# Patient Record
Sex: Female | Born: 1935 | Hispanic: Refuse to answer
Health system: Southern US, Community
[De-identification: ages and names within clinical notes are randomized; demographics above are authoritative.]

---

## 2005-09-20 ENCOUNTER — Ambulatory Visit: Payer: Self-pay | Admitting: Nurse Practitioner

## 2006-08-24 ENCOUNTER — Ambulatory Visit: Payer: Self-pay | Admitting: Ophthalmology

## 2006-11-09 ENCOUNTER — Ambulatory Visit: Payer: Self-pay | Admitting: Ophthalmology

## 2007-02-21 ENCOUNTER — Ambulatory Visit: Payer: Self-pay | Admitting: Nurse Practitioner

## 2008-07-04 ENCOUNTER — Other Ambulatory Visit: Payer: Self-pay

## 2008-07-04 ENCOUNTER — Inpatient Hospital Stay: Payer: Self-pay | Admitting: *Deleted

## 2008-07-12 ENCOUNTER — Encounter: Payer: Self-pay | Admitting: Internal Medicine

## 2008-07-20 ENCOUNTER — Encounter: Payer: Self-pay | Admitting: Internal Medicine

## 2008-08-07 ENCOUNTER — Ambulatory Visit: Payer: Self-pay | Admitting: Family Medicine

## 2008-09-19 ENCOUNTER — Ambulatory Visit: Payer: Self-pay | Admitting: Oncology

## 2008-09-30 ENCOUNTER — Inpatient Hospital Stay: Payer: Self-pay | Admitting: Internal Medicine

## 2008-10-20 ENCOUNTER — Ambulatory Visit: Payer: Self-pay | Admitting: Oncology

## 2009-12-08 ENCOUNTER — Inpatient Hospital Stay: Payer: Self-pay | Admitting: Specialist

## 2010-01-05 ENCOUNTER — Inpatient Hospital Stay: Payer: Self-pay | Admitting: Specialist

## 2010-04-19 ENCOUNTER — Inpatient Hospital Stay: Payer: Self-pay | Admitting: Internal Medicine

## 2010-04-25 ENCOUNTER — Encounter: Payer: Self-pay | Admitting: Internal Medicine

## 2010-04-30 ENCOUNTER — Inpatient Hospital Stay: Payer: Self-pay | Admitting: Internal Medicine

## 2010-05-04 ENCOUNTER — Ambulatory Visit: Payer: Self-pay | Admitting: Cardiovascular Disease

## 2010-05-20 ENCOUNTER — Encounter: Payer: Self-pay | Admitting: Internal Medicine

## 2010-06-19 ENCOUNTER — Encounter: Payer: Self-pay | Admitting: Internal Medicine

## 2010-07-20 ENCOUNTER — Ambulatory Visit: Payer: Self-pay | Admitting: Gastroenterology

## 2010-12-15 IMAGING — US ABDOMEN ULTRASOUND
1 series · 17 of 25 positions shown · non-contrast
Comparison: none

REASON FOR EXAM: r/o cholecystitis
COMMENTS:

PROCEDURE:     US  - US ABDOMEN GENERAL SURVEY  - May 02, 2010  [DATE]
RESULT:     Comparison: None
TECHNIQUE: Multiple gray-scale and color-flow Doppler images of the abdomen
are presented for review.

[Series 1: abdomen ultrasound · 17 of 50 slices shown]
[im 1/50]
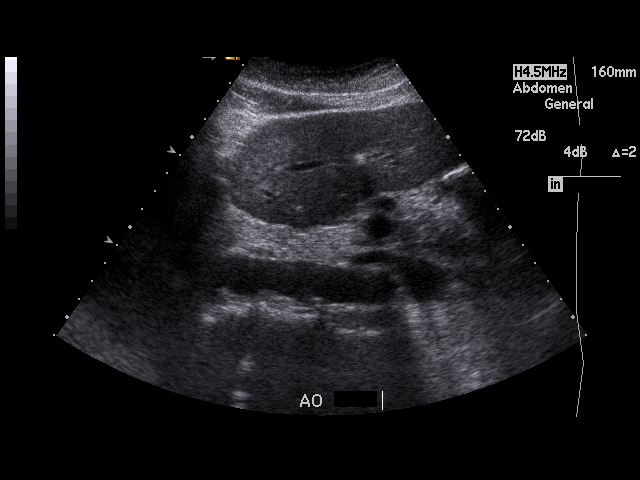
[im 5/50]
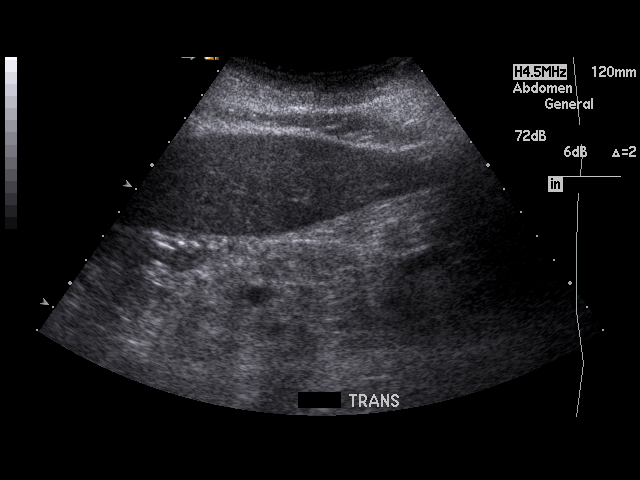
[im 7/50]
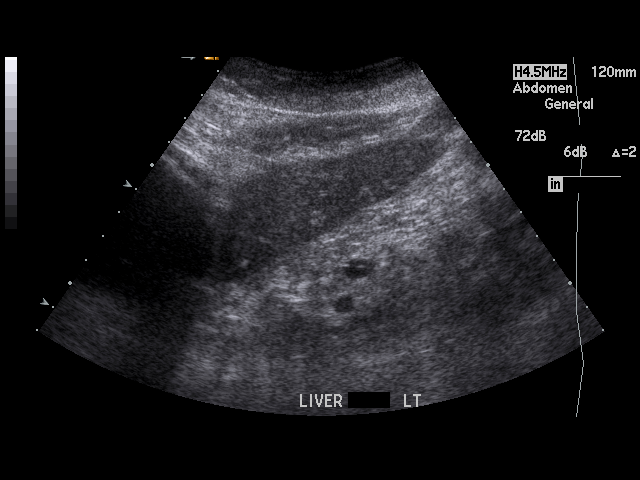
[im 11/50]
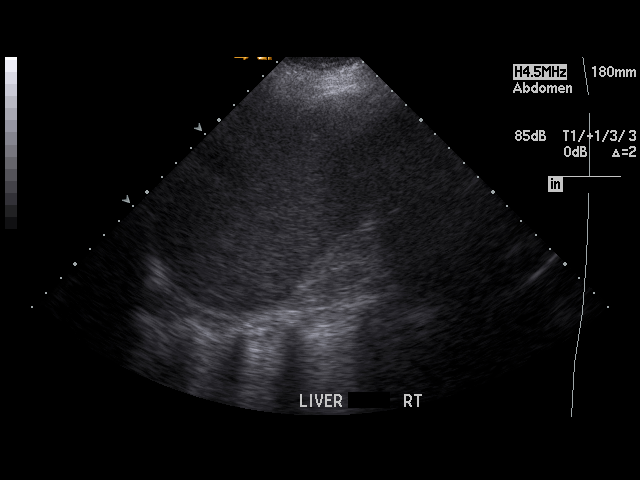
[im 13/50]
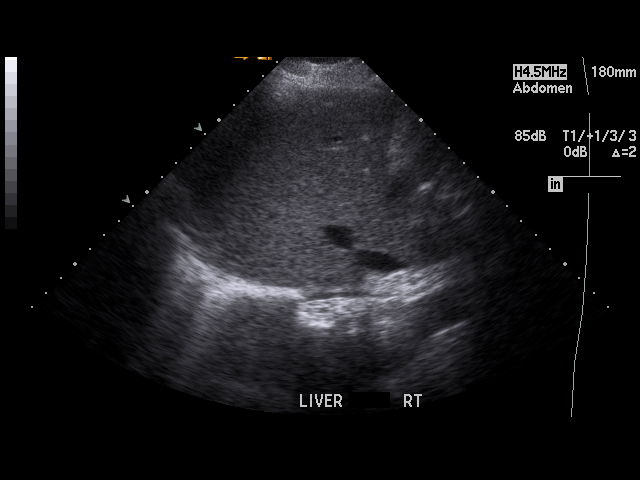
[im 17/50]
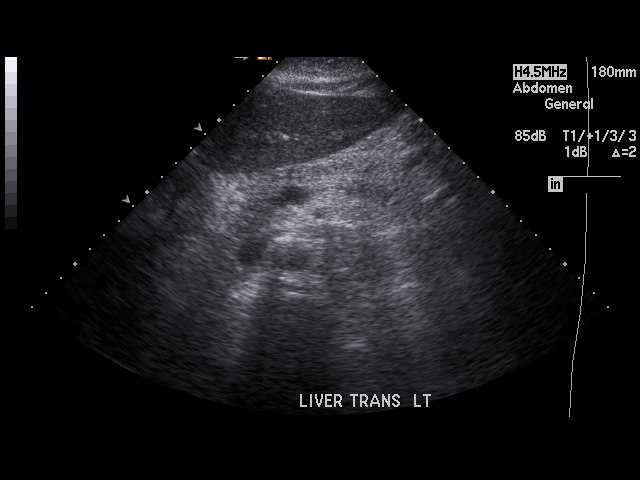
[im 19/50]
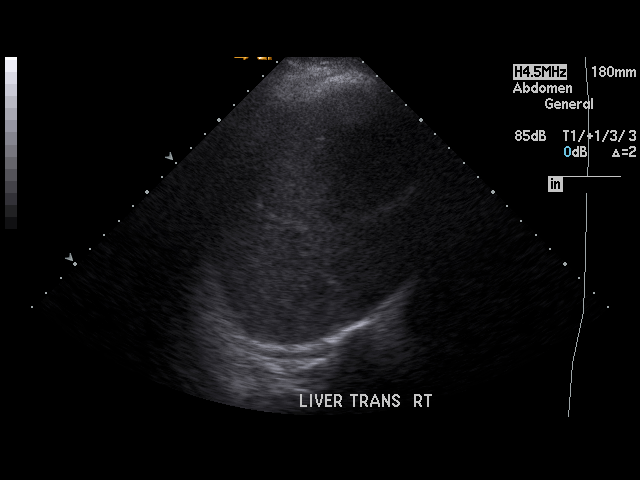
[im 23/50]
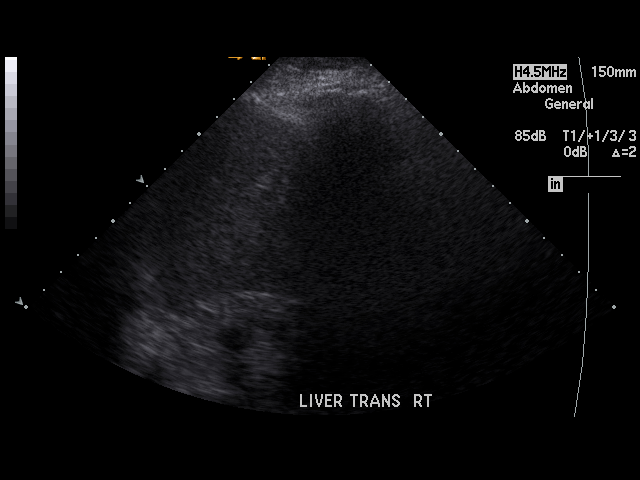
[im 25/50]
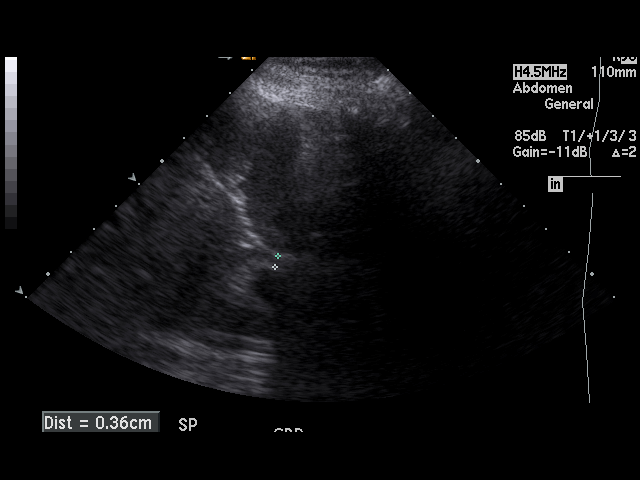
[im 27/50]
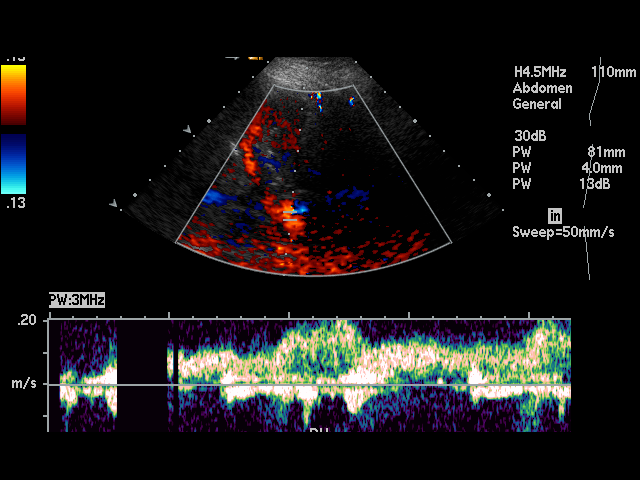
[im 31/50]
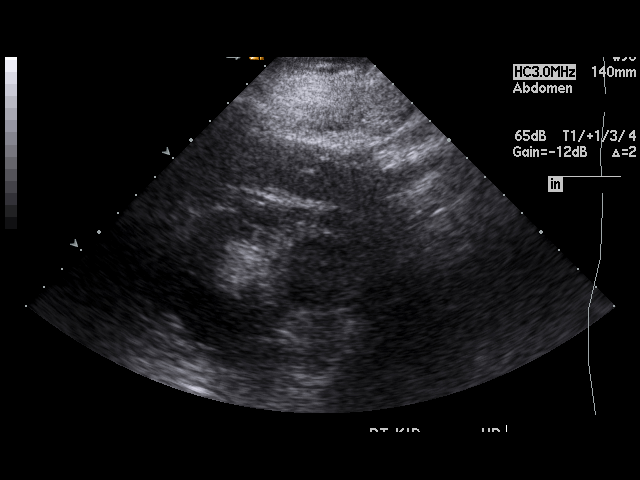
[im 33/50]
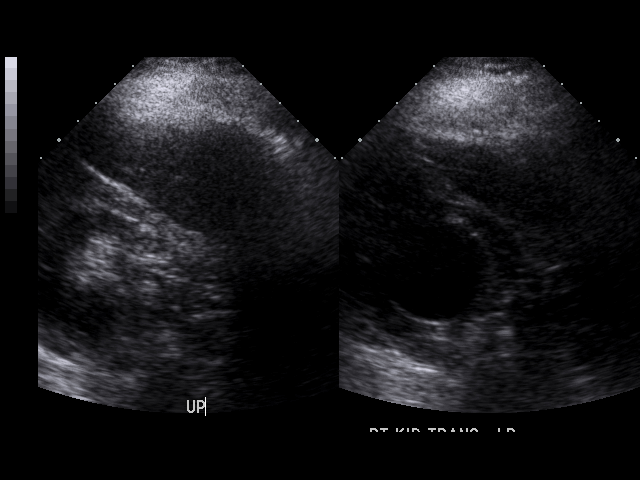
[im 37/50]
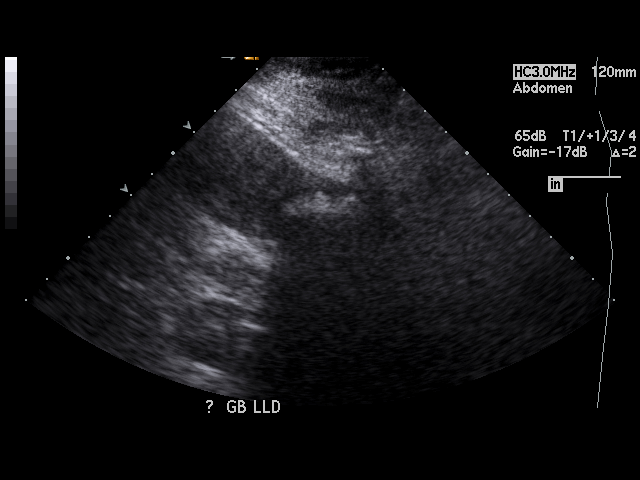
[im 39/50]
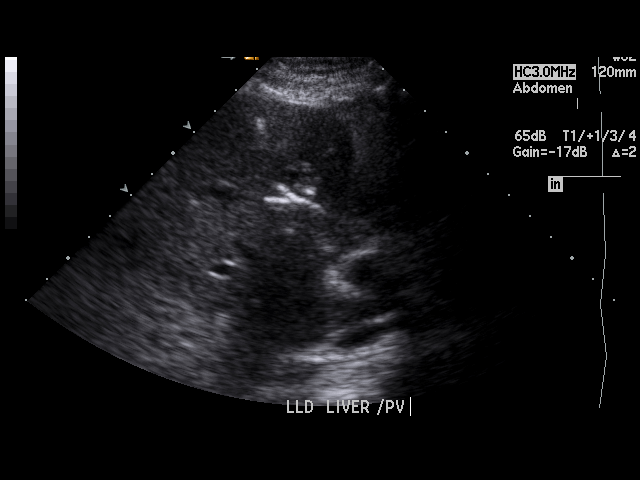
[im 43/50]
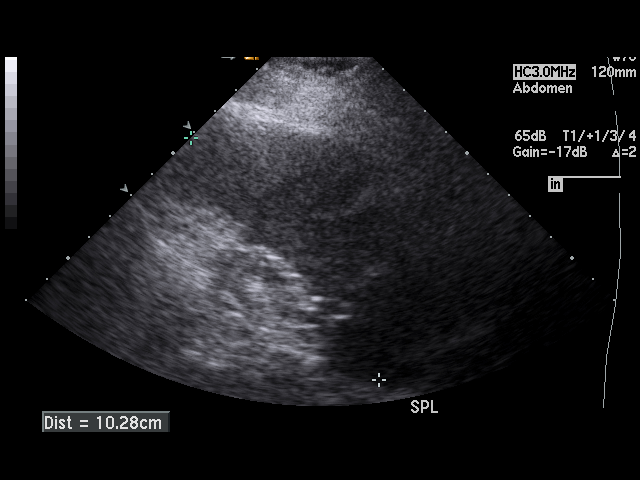
[im 45/50]
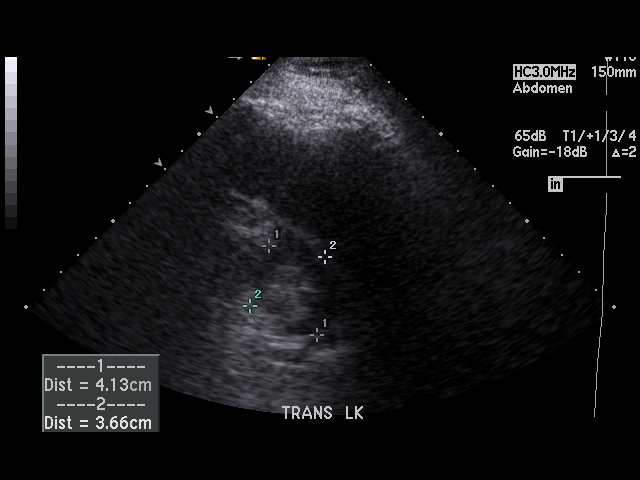
[im 50/50]
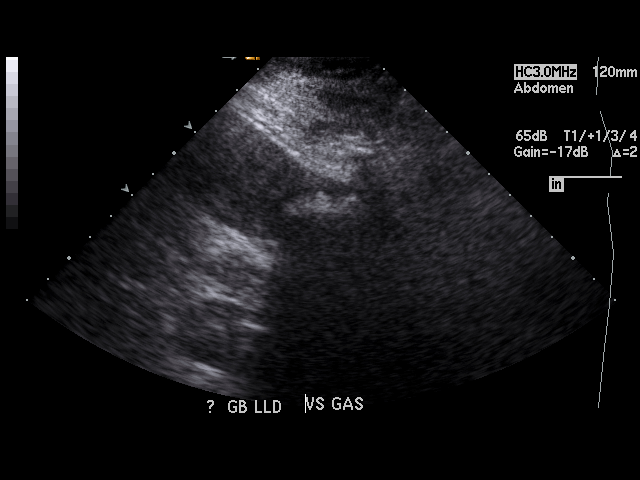

[17 of 25 positions shown; findings below may reference images not displayed]

FINDINGS: Visualized portions of the liver demonstrate normal echogenicity and normal
contours. The liver is without evidence of a focal hepatic lesion.

There is pneumobilia secondary to likely from ERCP yesterday which limits
evaluation. The gallbladder and common bile duct are difficult to visualized
secondary to pneumobilia. There are cholelithiasis. There is no definite
intrahepatic biliary ductal dilatation. The visualized portion of the common
bile duct measures 3.6 mm, but the entirety of the common bile duct is not
visualized.

The visualized portion of the pancreas is normal in echogenicity. The spleen
is unremarkable. Bilateral kidneys are normal in echogenicity and size. The
right kidney measures 9.2 x 4.5 x 3.5 cm. The left kidney measures 9.3 x
x 3.7 cm. Again noted are multiple right renal hypodense masses which are
suboptimally characterized on this exam, but appear unchanged from the prior
exam There are no renal calculi or hydronephrosis. The abdominal aorta and
IVC are unremarkable.
IMPRESSION: There is pneumobilia secondary to likely from ERCP yesterday which limits
evaluation. The gallbladder and common bile duct are difficult to visualized
secondary to pneumobilia. There are cholelithiasis. There is no definite
intrahepatic biliary ductal dilatation. The visualized portion of the common
bile duct measures 3.6 mm, but the entirety of the common bile duct is not
visualized.

## 2010-12-17 IMAGING — CR DG CHEST 1V PORT
1 series · 1 of 1 positions shown · non-contrast
Comparison: none

REASON FOR EXAM: line placement
COMMENTS:

[view not recorded]
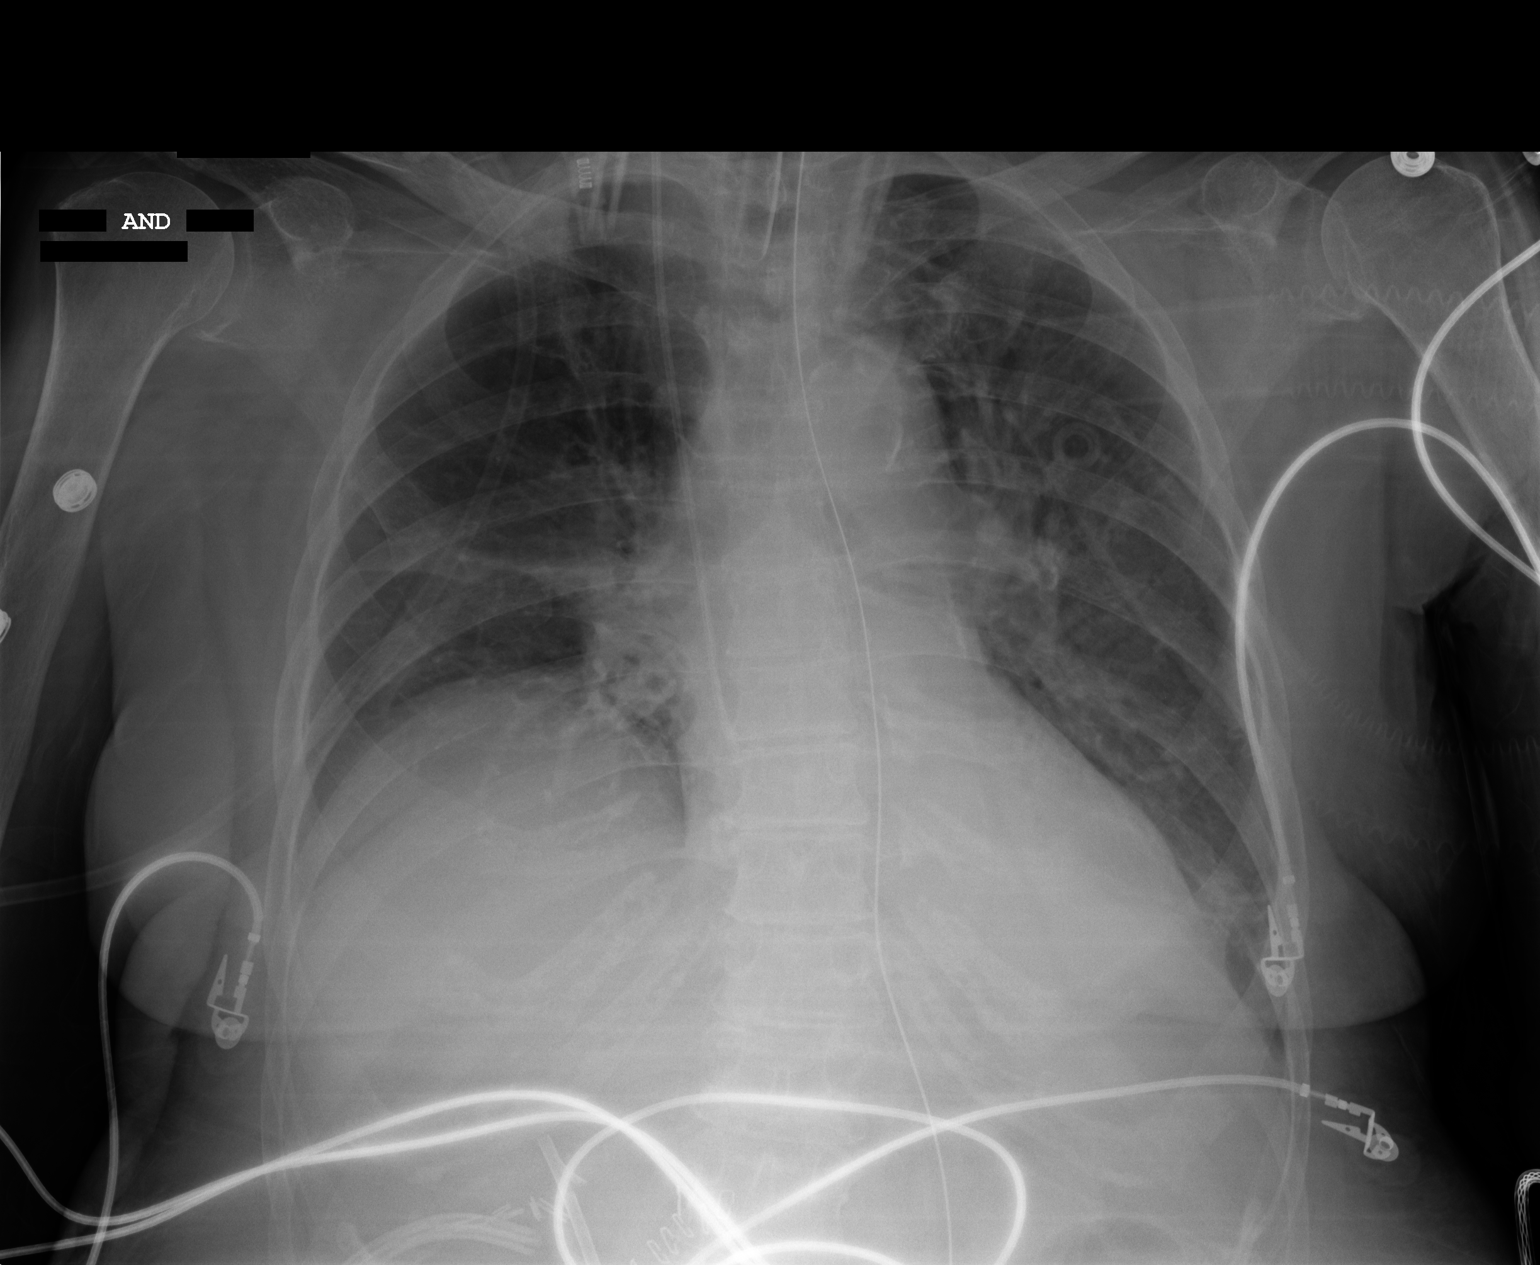

[1 of 1 positions shown; findings below may reference images not displayed]

PROCEDURE:     DXR - DXR PORTABLE CHEST SINGLE VIEW  - May 04, 2010  [DATE]

RESULT:     Endotracheal tube, NG tube and central line noted in good
anatomic position. Mild pulmonary venous congestion cannot be excluded.
Band-like atelectasis versus a small amount of fluid in the minor fissure is
noted on the right. Left lower lobe atelectasis and/or mild infiltrate is
noted. Mild perihilar infiltrates cannot be excluded.
IMPRESSION: Good tube positionings. Mild pulmonary venous congestion is
present. There is bibasilar atelectasis and/or mild infiltrates. Follow-up
chest x-rays suggested. No pneumothorax.

## 2010-12-17 IMAGING — CR DG ABDOMEN 1V
1 series · 1 of 1 positions shown · non-contrast
Comparison: none

REASON FOR EXAM: STAT open GB, unable to count
COMMENTS:

[view not recorded]
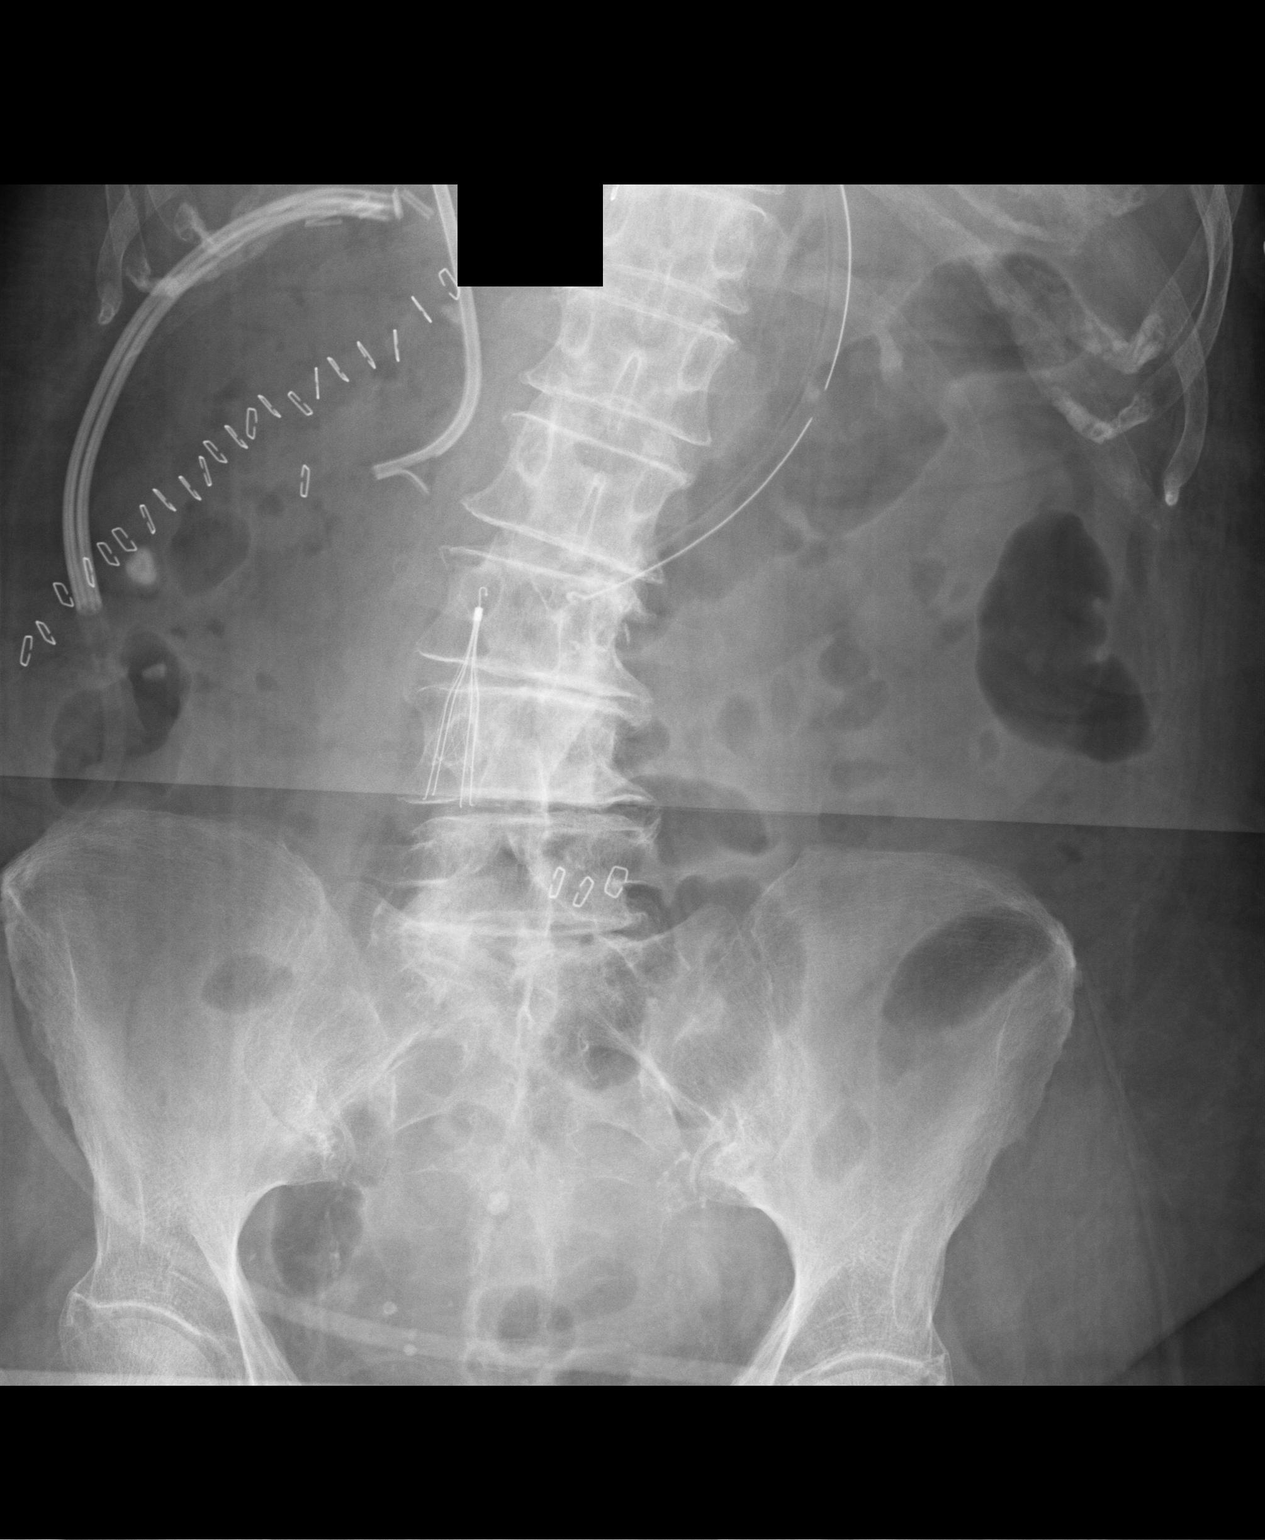

[1 of 1 positions shown; findings below may reference images not displayed]

PROCEDURE:     DXR - DXR ABDOMEN AP ONLY  - May 04, 2010  [DATE]

RESULT:     Biliary stent is noted. Surgical clips present in the right
upper quadrant. Drainage tube noted in the right upper quadrant. Surgical
clips noted in the mid abdomen. Inferior vena caval filter is noted. NG tube
is noted in good anatomic position. Gas pattern is nonspecific.
IMPRESSION: Postsurgical changes, no acute abnormality.

## 2010-12-19 IMAGING — CR DG CHEST 1V PORT
1 series · 1 of 1 positions shown · non-contrast
Comparison: none

REASON FOR EXAM: wheeze and post op
COMMENTS:

[view not recorded]
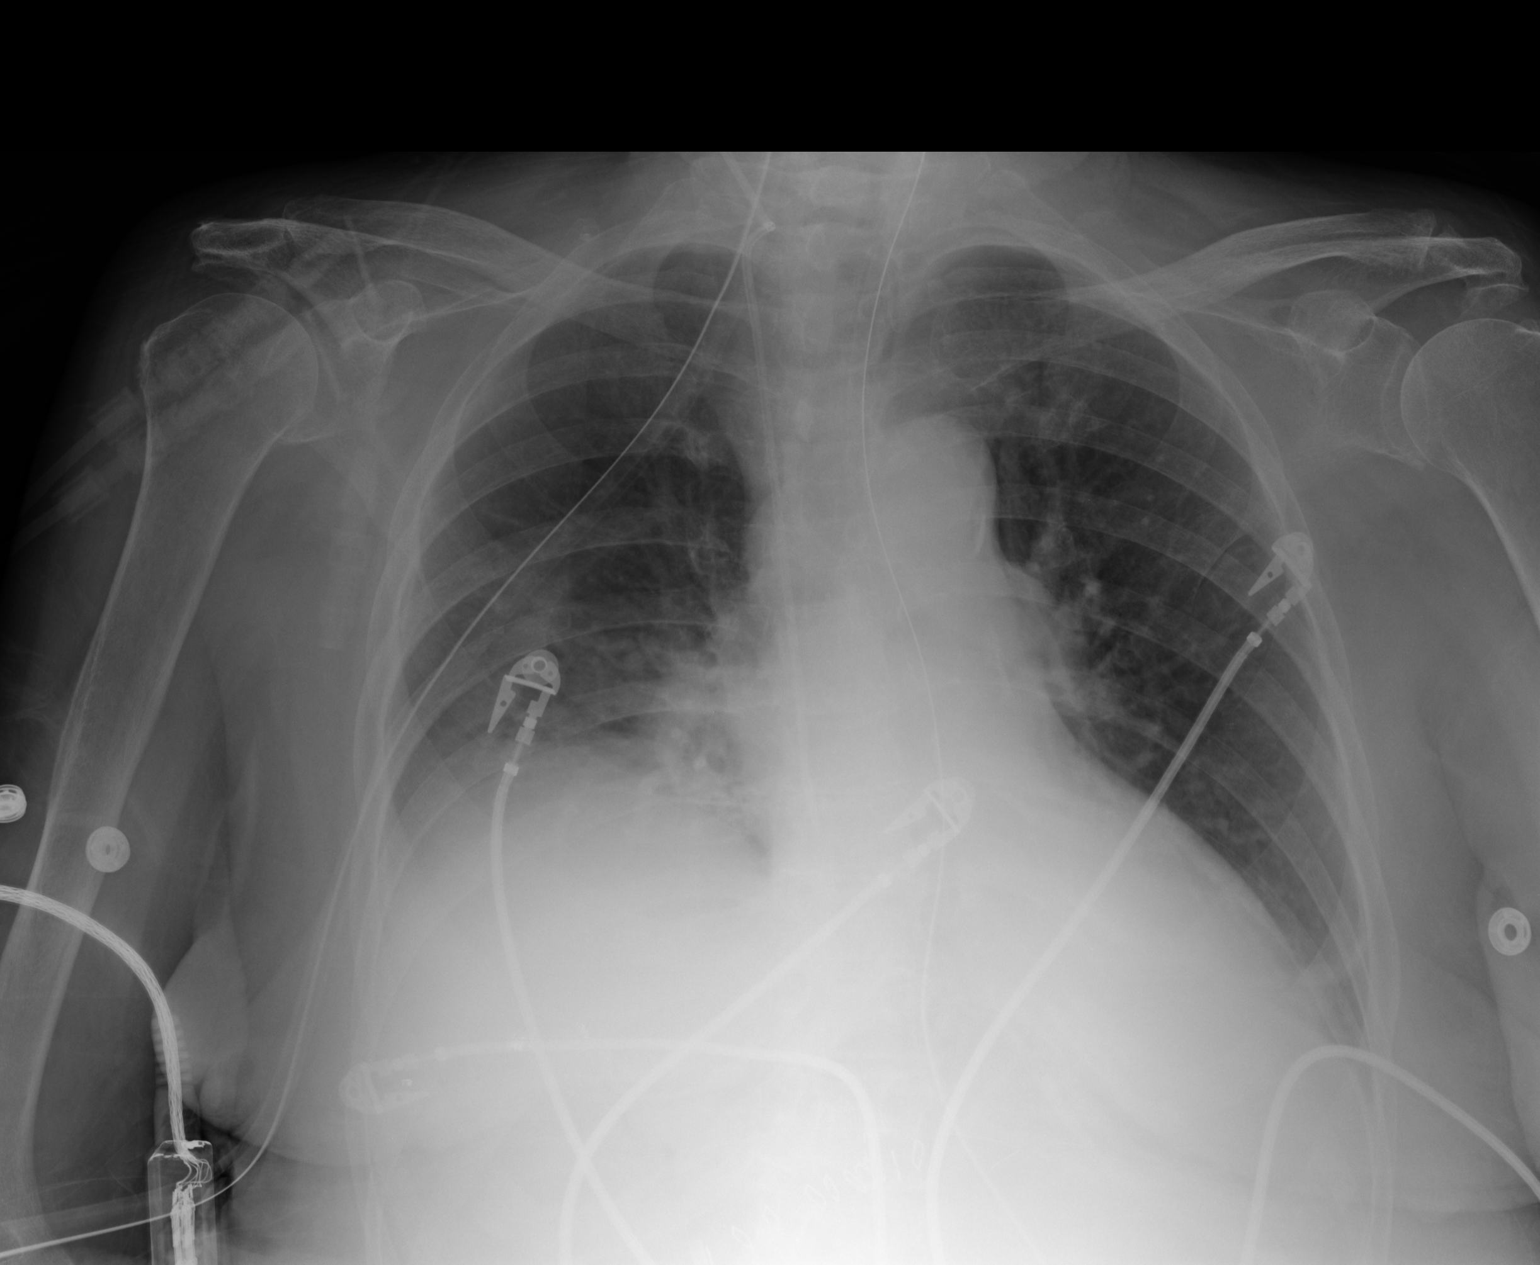

[1 of 1 positions shown; findings below may reference images not displayed]

PROCEDURE:     DXR - DXR PORTABLE CHEST SINGLE VIEW  - May 06, 2010  [DATE]

RESULT:     Comparison is made to a prior exam of 05/04/2010.

The previously present endotracheal tube has been removed. A venous catheter
is present on the right. A nasogastric tube remains present. The changes of
pulmonary vascular congestion show improvement since the prior exam. There
persists some residual increase in density at the right base compatible with
atelectasis or interstitial edema. No pleural effusion is seen. The heart
size is within the limits of normal.
IMPRESSION: Please see above.

## 2010-12-27 IMAGING — CR DG CHEST 1V PORT
1 series · 1 of 1 positions shown · non-contrast
Comparison: none

REASON FOR EXAM: fever
COMMENTS:

PROCEDURE:     DXR - DXR PORTABLE CHEST SINGLE VIEW  - May 14, 2010  [DATE]
RESULT:     Comparison is made to a prior study dated 05/06/2010. There has
been partial clearing of the right lower lobe infiltrate. Left lung is clear.
The cardiovascular structures are unremarkable.

[view not recorded]
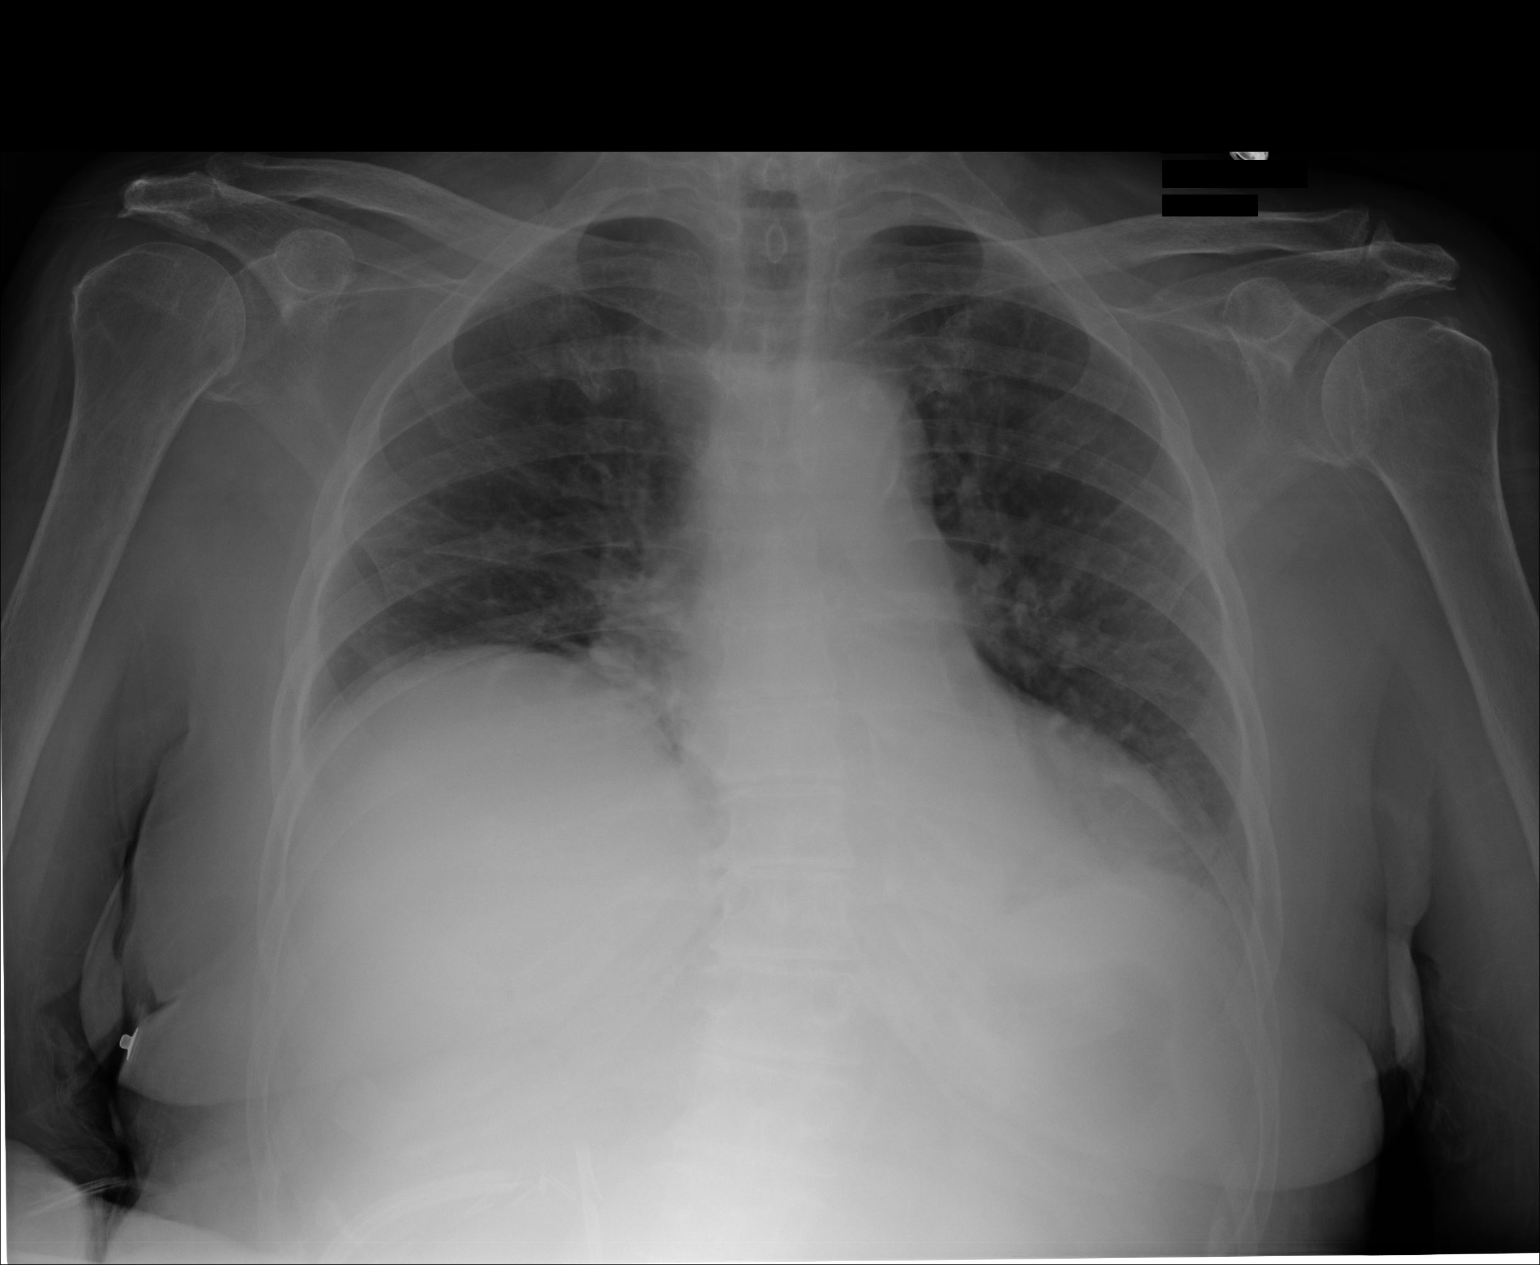

[1 of 1 positions shown; findings below may reference images not displayed]

IMPRESSION: Partial clearing of right lower lobe infiltrate.

## 2011-11-20 ENCOUNTER — Ambulatory Visit: Payer: Self-pay | Admitting: Internal Medicine

## 2011-12-09 ENCOUNTER — Inpatient Hospital Stay: Payer: Self-pay | Admitting: Internal Medicine

## 2011-12-21 ENCOUNTER — Ambulatory Visit: Payer: Self-pay | Admitting: Internal Medicine

## 2011-12-21 DEATH — deceased

## 2015-04-13 NOTE — Discharge Summary (Signed)
PATIENT NAME:  Chelsea Mccarthy, Chelsea Mccarthy MR#:  [id number] DATE OF BIRTH:  [birth year]  DATE OF ADMISSION:  12/09/2011 DATE OF DISCHARGE:    PRIMARY CARE PHYSICIAN:  Dr. Elease Hashimoto  FINAL DIAGNOSES:  1. Systemic inflammatory response syndrome with aspiration pneumonia, possible urinary tract infection.  2. Acute on chronic anemia.  3. Failure to thrive.  4. Dementia.  5. Acute renal failure on chronic kidney disease.  6. Hypothyroidism.  7. Hypernatremia.   MEDICATIONS ON DISCHARGE:  1. Ferrous sulfate 325 mg 3 times a day.  2. DuoNeb nebulizer solution 3 times a day.  3. Omeprazole 20 mg twice a day.  4. Lasix 20 mg daily.  5. Levothyroxine 88 mcg daily.  6. Potassium chloride 20 mEq daily.  7. Vitamin B12 2000 mcg daily.  8. Norvasc 5 mg daily.  9. Colace 100 mg at bedtime as needed for constipation.  10. Augmentin 500 mg twice a day for five days then stop.   DIET: Low-sodium diet, pureed diet with thickened liquids. Special instructions: Honey consistency liquids by teaspoon or small sips.  Medications in puree, crushed if necessary. Feeding assistance at meals. Aspiration precautions including sitting fully upright with oral intake. Small bites and sips slowly. Moistened food. Check for oral cleaning. Alternate foods and liquids.   ACTIVITY: As tolerated.   FOLLOWUP: Follow up with Dr. Elease Hashimoto.   REASON FOR ADMISSION: The patient was admitted 12/09/2011, discharged back to facility 12/14/2011. Came in with blood in the stool and fever.   HISTORY OF PRESENT ILLNESS: 81 year old female with history of pulmonary embolism, deep vein thrombosis, hypertension, hypothyroidism, and dementia.  She was admitted and found to have a right lower lobe pneumonia, likely aspiration. She was started on IV Zosyn. Speech pathology evaluation. For acute on chronic anemia her hemoglobin was followed during the hospital stay. She did have acute on chronic renal failure and she was given IV fluids and held on her  Lasix.  LABORATORY, DIAGNOSTIC, AND RADIOLOGICAL DATA: Chest x-ray showed right lower lobe opacity. Blood culture negative. TSH 3.63, glucose 112, BUN 50, creatinine 3.07, sodium 143, potassium 4.5, chloride 111, CO2 21, calcium 8.1. White blood cell count 15.9, hemoglobin 8.7, hematocrit 26.6, platelet count 264. Urinalysis showed 2+ blood, trace leukocyte esterase. Repeat blood cultures on the 22nd negative. C. difficile negative. White blood cell count upon discharge 11.7, hemoglobin 10.2, hematocrit 31.1, platelet count 336, glucose 88, BUN 39, creatinine 2.44, sodium 153, potassium 3.7, chloride 117.   HOSPITAL COURSE PER PROBLEM LIST:  1. Systemic inflammatory response syndrome:  I believe secondary to aspiration pneumonia, possible urinary tract infection. The patient was on IM Rocephin prior to coming in. The Zosyn that was given should cover. The patient was seen by speech pathology, diet adjusted.  The patient will be switched to Augmentin upon discharge. The patient clinically looked better and was stable for discharge on the 25th to complete the course as an outpatient. The patient is a high risk for continued aspiration. Daughter does not want any feeding tube.  2. Acute on chronic anemia: The patient was transfused 1 unit of packed red blood cells with good response, hemoglobin up to 10.2 upon discharge. No luminal study at this point.  3. Failure to thrive: Probably secondary to being on the dysphagia diet, hard to get things down. Need to feed with assistance.  4. Dementia.  5. Acute renal failure on chronic kidney disease: The patient was given IV fluids during the entire hospital stay. Creatinine now back to  baseline, can restart the Lasix and potassium, but must be careful with that. With the amount of intake that she is having she is high risk for dehydration. May consider lowering the dose or even doing the Lasix on a p.r.n. basis as outpatient.  6. Hypothyroidism: Continue  levothyroxine. TSH normal range.  7. Hypernatremia: May be because she was getting normal saline throughout most of the hospital stay. She was given a bolus of D5W prior to discharge back to the nursing home.  Recommend checking a BMP next week. Encourage liquids.   CODE STATUS: The patient is a DO NOT RESUSCITATE.   TIME SPENT ON DISCHARGE: 35 minutes.   ____________________________ Herschell Dimesichard J. Renae GlossWieting, MD rjw:bjt D: 12/14/2011 10:14:47 ET T: 12/14/2011 10:48:11 ET JOB#: [id number]  cc: Herschell Dimesichard J. Renae GlossWieting, MD, <Dictator> Leo GrosserNancy J. Maloney, MD Salley ScarletICHARD J Deepak Bless MD ELECTRONICALLY SIGNED 01/05/2012 15:59

## 2015-04-13 NOTE — H&P (Signed)
PATIENT NAME:  Chelsea Mccarthy, Chelsea Mccarthy MR#:  [id number] DATE OF BIRTH:  [birth year]  DATE OF ADMISSION:  12/09/2011  PRIMARY CARE PHYSICIAN:  Dr. Lorie PhenixNancy Mccarthy   CHIEF COMPLAINT: Blood in stools and fever.   HISTORY OF PRESENT ILLNESS:  Chelsea Mccarthy is a 81 year old Caucasian female with past medical history of pulmonary embolus and deep vein thrombosis pulmonary embolus along with history of hypertension and hypothyroidism who comes [facility] the Emergency Room [facility] with the above-mentioned chief complaint. The patient has dementia, unable to give any history or review of systems. History is obtained from old records and the patient's daughter. Chelsea Mccarthy was started on IM Rocephin on 12/06/2011 per nursing home records for urinary tract infection. She is supposed to finish up a seven-day course which ends on 12/12/2011. She takes iron pills. Her stools are dark and she was found to be anemic with some blood in her stool. She does have longstanding history of hemorrhoids. No colonoscopy or EGD per daughter in the past. The patient also was noted to have fever of 101.9 and she was very tachycardic. She was brought to the Emergency Room and chest x-ray evaluation showed right lower lobe pneumonia. Per daughter, the patient had a swallow evaluation done a couple of days ago at the nursing home. The patient was put on pureed and nectar thick liquid diet given her symptoms of aspiration per the swallow test.  Chelsea Mccarthy is being admitted with SIRS due to aspiration pneumonia. She received a dose of Zosyn in the Emergency Room.   PAST MEDICAL HISTORY:  1. Pulmonary embolus in October 2009.  2. Deep vein thrombosis in October 2009.  3. Hypertension.  4. Hypothyroidism.  5. Cholelithiasis.  6. Anemia of chronic disease.  7. Urinary tract infection.  8. History of chronic obstructive pulmonary disease/emphysema. 9. Status post IVC filter.  10. The patient was admitted in the past with sepsis from cholangitis. She  had undergone at that time ERCP and stent placement and thereafter stent removal.   REVIEW OF SYSTEMS: Unobtainable since the patient has altered mental status and is very demented.   ALLERGIES: Coumadin and Plavix.   MEDICATIONS PER NURSING HOME:  1. Rocephin IM 1 gram q. 24.  Start 12/06/2011, stop 12/13/2011.   2. Pureed diet with nectar thick liquids.  3. Prilosec 20 mg b.i.d.  4. Synthroid 88 mcg p.o. daily.  5. Potassium chloride 20 mEq p.o. daily.  6. Vitamin B12 1000 mcg p.o. daily.  7. Norvasc 5 mg daily.  8. Lasix 20 mg daily.  9. Ferrous sulfate 325 mg, 1 tablet p.o. t.i.d. with meals.  10. Eucerin cream, apply to affected area t.i.d. p.r.n.  11. DuoNeb 0.5 mg, use one vial in nebulizer t.i.d. p.r.n. wheezing.  12. Colace 100 mg 1 capsule p.o. at bedtime p.r.n.  13. Hemorrhoidal cream q.i.d. p.r.n.   PHYSICAL EXAMINATION:  GENERAL: The patient is resting quietly.   VITAL SIGNS: She has fever of 101, tachycardic with pulse 111, blood pressure 133/73, sats 97% on room air. Exam is limited, again, secondary to patient's cooperation and dementia.   HEENT: Atraumatic, normocephalic. Pupils equal, round, reactive to light and accommodation. Oral mucosa is moist.   NECK: Supple. No JVD. No carotid bruit.   RESPIRATORY: Decreased breath sounds in the bases. No respiratory distress or labored breathing.   CARDIOVASCULAR: Tachycardia present. Both the heart sounds are normal.  No murmur heard. PMI is not lateralized. Chest is nontender.   EXTREMITIES: Good pedal  pulses, good femoral pulses. No lower extremity edema.  The patient has puffy legs.   NEURO: Again unable to assess secondary to patient cooperation and dementia.     SKIN: Warm and dry. She has different stages of bruising on both upper extremities.  LABORATORY, DIAGNOSTIC, AND RADIOLOGICAL DATA:  Chest x-ray: Heterogenous  opacities in the right lower lung secondary to infection. White count 15.9, hemoglobin and  hematocrit is 8.7 and 26.6, platelet count 264, MCV 89. Glucose 112, BUN 50, creatinine 3.07. Sodium 143, potassium 4.5, chloride 111, bicarbonate 21, calcium is 8.1, bilirubin total was 0.2, alkaline phosphatase 230. SGPT 11, SGOT 15, albumin 2. PT-INR 12.8-1. TSH 3.63.   ASSESSMENT AND PLAN: Chelsea Mccarthy with:  1. SIRS due to right lower lobe pneumonia, likely aspiration. The patient had a positive swallow evaluation at the nursing home for aspiration. She is on dysphagia I diet per daughter. The patient has fever, tachycardia, and elevated white count with abnormal chest x-ray.  2. Acute on chronic anemia with heme-positive stool. Has history of hemorrhoids, not actively bleeding. Daughter does not want to pursue any luminal evaluation. Hemoglobin was 10.7 on 11/23/2011, today is 8.9. She is not actively bleeding. She is on iron pills.  3. Urinary tract infection, on IM Rocephin.  4. Failure to thrive.  5. Advanced dementia.  6. Acute on chronic renal failure with baseline creatinine of 2.7. 7. CODE STATUS: DO NOT RESUSCITATE.   PLAN:  1. Admit the patient to the medical floor with off-unit telemetry.  2. N.p.o. except medications.  3. IV fluids for hydration.  4. We will start the patient on IV Zosyn, renal dosing. Continue home medications with amlodipine, ferrous sulfate, levothyroxine, and omeprazole once the patient is awake and able to take medications with applesauce.  5. Speech evaluation and help with dysphagia.  6. Follow blood cultures, sputum cultures if the patient is able to produce.  7. Blood transfusion as needed. The family again does not want any luminal evaluation.  They are  okay with blood transfusion as needed.  8. Hospital admission plan was discussed with the patient's daughter, Chelsea Mccarthy, who is agreeable to it.  9. The patient is  DO NOT RESUSCITATE.   CRITICAL TIME SPENT: 60 minutes.  ____________________________ Chelsea Hail Chelsea Katz,  MD sap:bjt D: 12/09/2011 21:15:01 ET T: 12/10/2011 07:01:43 ET JOB#: [id number]  cc: Chelsea Mccarthy A. Chelsea Katz, MD, <Dictator> Leo Grosser, MD Willow Ora MD ELECTRONICALLY SIGNED 12/23/2011 14:42
